# Patient Record
Sex: Female | Born: 1980 | Hispanic: Yes | Marital: Single | Smoking: Never smoker
Health system: Southern US, Community
[De-identification: ages and names within clinical notes are randomized; demographics above are authoritative.]

## PROBLEM LIST (undated history)

## (undated) DIAGNOSIS — E119 Type 2 diabetes mellitus without complications: Secondary | ICD-10-CM

## (undated) HISTORY — PX: OTHER SURGICAL HISTORY: SHX169

---

## 2021-01-02 ENCOUNTER — Emergency Department
Admission: EM | Admit: 2021-01-02 | Discharge: 2021-01-03 | Disposition: A | Discharge status: Home / Self Care | Payer: Self-pay | Attending: Emergency Medicine | Admitting: Emergency Medicine

## 2021-01-02 ENCOUNTER — Encounter: Payer: Self-pay | Admitting: Emergency Medicine

## 2021-01-02 ENCOUNTER — Other Ambulatory Visit: Payer: Self-pay

## 2021-01-02 DIAGNOSIS — E119 Type 2 diabetes mellitus without complications: Secondary | ICD-10-CM | POA: Insufficient documentation

## 2021-01-02 DIAGNOSIS — R1084 Generalized abdominal pain: Secondary | ICD-10-CM | POA: Insufficient documentation

## 2021-01-02 DIAGNOSIS — R197 Diarrhea, unspecified: Secondary | ICD-10-CM | POA: Insufficient documentation

## 2021-01-02 DIAGNOSIS — R110 Nausea: Secondary | ICD-10-CM | POA: Insufficient documentation

## 2021-01-02 HISTORY — DX: Type 2 diabetes mellitus without complications: E11.9

## 2021-01-02 LAB — COMPREHENSIVE METABOLIC PANEL
ALT: 18 U/L (ref 0–44)
AST: 21 U/L (ref 15–41)
Albumin: 4.2 g/dL (ref 3.5–5.0)
Alkaline Phosphatase: 65 U/L (ref 38–126)
Anion gap: 10 (ref 5–15)
BUN: 13 mg/dL (ref 6–20)
CO2: 23 mmol/L (ref 22–32)
Calcium: 9 mg/dL (ref 8.9–10.3)
Chloride: 100 mmol/L (ref 98–111)
Creatinine, Ser: 0.58 mg/dL (ref 0.44–1.00)
GFR, Estimated: >60 mL/min (ref >60)
Glucose, Bld: 152 mg/dL — ABNORMAL HIGH (ref 70–99)
Potassium: 3.7 mmol/L (ref 3.5–5.1)
Sodium: 133 mmol/L — ABNORMAL LOW (ref 135–145)
Total Bilirubin: 0.6 mg/dL (ref 0.3–1.2)
Total Protein: 8 g/dL (ref 6.5–8.1)

## 2021-01-02 LAB — CBC
HCT: 41 % (ref 36.0–46.0)
Hemoglobin: 14.3 g/dL (ref 12.0–15.0)
MCH: 30.5 pg (ref 26.0–34.0)
MCHC: 34.9 g/dL (ref 30.0–36.0)
MCV: 87.4 fL (ref 80.0–100.0)
Platelets: 439 10*3/uL — ABNORMAL HIGH (ref 150–400)
RBC: 4.69 MIL/uL (ref 3.87–5.11)
RDW: 11.9 % (ref 11.5–15.5)
WBC: 18.7 10*3/uL — ABNORMAL HIGH (ref 4.0–10.5)
nRBC: 0 % (ref 0.0–0.2)

## 2021-01-02 LAB — URINALYSIS, COMPLETE (UACMP) WITH MICROSCOPIC
Bilirubin Urine: NEGATIVE
Glucose, UA: 150 mg/dL — AB
Hgb urine dipstick: NEGATIVE
Ketones, ur: 20 mg/dL — AB
Leukocytes,Ua: NEGATIVE
Nitrite: NEGATIVE
Protein, ur: 100 mg/dL — AB
Specific Gravity, Urine: 1.033 — ABNORMAL HIGH (ref 1.005–1.030)
pH: 5 (ref 5.0–8.0)

## 2021-01-02 LAB — LIPASE, BLOOD: Lipase: 22 U/L (ref 11–51)

## 2021-01-02 MED ORDER — MORPHINE SULFATE (PF) 4 MG/ML IV SOLN
4.0000 mg | Freq: Once | INTRAVENOUS | Status: AC
  Administered 2021-01-03: 4 mg via INTRAVENOUS
  Filled 2021-01-02: qty 1

## 2021-01-02 MED ORDER — ONDANSETRON HCL 4 MG/2ML IJ SOLN
4.0000 mg | Freq: Once | INTRAMUSCULAR | Status: AC
  Administered 2021-01-03: 4 mg via INTRAVENOUS
  Filled 2021-01-02: qty 2

## 2021-01-02 MED ORDER — LACTATED RINGERS IV BOLUS
1000.0000 mL | Freq: Once | INTRAVENOUS | Status: AC
  Administered 2021-01-03: 1000 mL via INTRAVENOUS

## 2021-01-02 NOTE — ED Triage Notes (Signed)
Pt reports that she is having abd pain for the last few days. Denies any N/V has had small BMs.

## 2021-01-03 ENCOUNTER — Emergency Department: Payer: Self-pay

## 2021-01-03 ENCOUNTER — Encounter: Payer: Self-pay | Admitting: Radiology

## 2021-01-03 LAB — PREGNANCY, URINE: Preg Test, Ur: NEGATIVE

## 2021-01-03 MED ORDER — DICYCLOMINE HCL 20 MG PO TABS: 20.0000 mg | ORAL_TABLET | Freq: Three times a day (TID) | ORAL | 0 refills | Status: AC

## 2021-01-03 MED ORDER — IOHEXOL 350 MG/ML SOLN
100.0000 mL | Freq: Once | INTRAVENOUS | Status: AC | PRN
  Administered 2021-01-03: 100 mL via INTRAVENOUS

## 2021-01-03 MED ORDER — ONDANSETRON 4 MG PO TBDP: 4.0000 mg | ORAL_TABLET | Freq: Three times a day (TID) | ORAL | 0 refills | Status: DC | PRN

## 2021-01-03 MED ORDER — IBUPROFEN 600 MG PO TABS: 600.0000 mg | ORAL_TABLET | Freq: Three times a day (TID) | ORAL | 0 refills | Status: DC | PRN

## 2021-01-03 NOTE — ED Notes (Signed)
Dr. Erma Heritage at bedside. Interpreter assist in MD and nursing assessment.

## 2021-01-03 NOTE — ED Notes (Signed)
Pt. Provided gingerale, ice water, and graham crackers for PO challenge.

## 2021-01-03 NOTE — ED Notes (Signed)
Pt. Placed on cont. Pulse ox and BP monitoring.

## 2021-01-03 NOTE — ED Provider Notes (Signed)
[facility] Emergency Department Provider Note  ____________________________________________   Event Date/Time   First MD Initiated Contact with Patient 01/02/21 2344     (approximate)  I have reviewed the triage vital signs and the nursing notes.   HISTORY  Chief Complaint Abdominal Pain    HPI Danielle Lozano is a 40 y.o. female with history of diabetes here with generalized abdominal pain, nausea, diarrhea.  The patient states that for the last 2 or 3 days, she has had mild, generalized, abdominal pain.  She said associated nausea and loose stools.  She has had associated abdominal cramping.  Denies known sick contacts.  No suspicious food intakes.  No specific alleviating or aggravating factors.  Pain seems to be intermittent and not persistent.No known COVID exposures.  No blood in her stools.  No urinary symptoms or vaginal bleeding or discharge.    Past Medical History:  Diagnosis Date   Diabetes (HCC)     There are no problems to display for this patient.     Prior to Admission medications   Medication Sig Start Date End Date Taking? Authorizing Provider  dicyclomine (BENTYL) 20 MG tablet Take 1 tablet (20 mg total) by mouth 3 (three) times daily before meals for 7 days. 01/03/21 01/10/21 Yes Shaune Pollack, MD  ibuprofen (ADVIL) 600 MG tablet Take 1 tablet (600 mg total) by mouth every 8 (eight) hours as needed. 01/03/21  Yes Shaune Pollack, MD  ondansetron (ZOFRAN ODT) 4 MG disintegrating tablet Take 1 tablet (4 mg total) by mouth every 8 (eight) hours as needed for nausea or vomiting. 01/03/21  Yes Shaune Pollack, MD    Allergies Patient has no known allergies.  No family history on file.  Social History    Review of Systems  Review of Systems  Constitutional:  Positive for fatigue. Negative for fever.  HENT:  Negative for congestion and sore throat.   Eyes:  Negative for visual disturbance.  Respiratory:  Negative for cough  and shortness of breath.   Cardiovascular:  Negative for chest pain.  Gastrointestinal:  Positive for abdominal pain, diarrhea, nausea and vomiting.  Genitourinary:  Negative for flank pain.  Musculoskeletal:  Negative for back pain and neck pain.  Skin:  Negative for rash and wound.  Neurological:  Negative for weakness.  All other systems reviewed and are negative.   ____________________________________________  PHYSICAL EXAM:      VITAL SIGNS: ED Triage Vitals  Enc Vitals Group     BP 01/02/21 1911 125/73     Pulse Rate 01/02/21 1905 94     Resp 01/02/21 1905 18     Temp 01/02/21 1905 98.3 F (36.8 C)     Temp Source 01/02/21 1905 Oral     SpO2 01/02/21 1905 98 %     Weight 01/02/21 1910 182 lb (82.6 kg)     Height 01/02/21 1910 5\' 1"  (1.549 m)     Head Circumference --      Peak Flow --      Pain Score 01/02/21 1911 10     Pain Loc --      Pain Edu? --      Excl. in GC? --      Physical Exam Vitals and nursing note reviewed.  Constitutional:      General: She is not in acute distress.    Appearance: She is well-developed.  HENT:     Head: Normocephalic and atraumatic.  Eyes:  Conjunctiva/sclera: Conjunctivae normal.  Cardiovascular:     Rate and Rhythm: Normal rate and regular rhythm.     Heart sounds: Normal heart sounds. No murmur heard.   No friction rub.  Pulmonary:     Effort: Pulmonary effort is normal. No respiratory distress.     Breath sounds: Normal breath sounds. No wheezing or rales.  Abdominal:     General: Bowel sounds are increased. There is no distension.     Palpations: Abdomen is soft.     Tenderness: There is no abdominal tenderness. There is no guarding or rebound. Negative signs include Murphy's sign and McBurney's sign.  Musculoskeletal:     Cervical back: Neck supple.  Skin:    General: Skin is warm.     Capillary Refill: Capillary refill takes less than 2 seconds.  Neurological:     Mental Status: She is alert and oriented to  person, place, and time.     Motor: No abnormal muscle tone.      ____________________________________________   LABS (all labs ordered are listed, but only abnormal results are displayed)  Labs Reviewed  COMPREHENSIVE METABOLIC PANEL - Abnormal; Notable for the following components:      Result Value   Sodium 133 (*)    Glucose, Bld 152 (*)    All other components within normal limits  CBC - Abnormal; Notable for the following components:   WBC 18.7 (*)    Platelets 439 (*)    All other components within normal limits  URINALYSIS, COMPLETE (UACMP) WITH MICROSCOPIC - Abnormal; Notable for the following components:   Color, Urine YELLOW (*)    APPearance CLOUDY (*)    Specific Gravity, Urine 1.033 (*)    Glucose, UA 150 (*)    Ketones, ur 20 (*)    Protein, ur 100 (*)    Bacteria, UA FEW (*)    All other components within normal limits  LIPASE, BLOOD  PREGNANCY, URINE  POC URINE PREG, ED    ____________________________________________  EKG:  ________________________________________  RADIOLOGY All imaging, including plain films, CT scans, and ultrasounds, independently reviewed by me, and interpretations confirmed via formal radiology reads.  ED MD interpretation:   CT abdomen/pelvis: Fatty liver, otherwise no acute changes  Official radiology report(s): CT ABDOMEN PELVIS W CONTRAST  Result Date: 01/03/2021 CLINICAL DATA:  Nausea and vomiting for few days EXAM: CT ABDOMEN AND PELVIS WITH CONTRAST TECHNIQUE: Multidetector CT imaging of the abdomen and pelvis was performed using the standard protocol following bolus administration of intravenous contrast. CONTRAST:  OMNIPAQUE IOHEXOL 350 MG/ML SOLN COMPARISON:  None. FINDINGS: Lower chest: No acute abnormality. Hepatobiliary: Mild fatty infiltration of the liver is noted. No focal mass is seen. Gallbladder is within normal limits. Pancreas: Unremarkable. No pancreatic ductal dilatation or surrounding inflammatory  changes. Spleen: Normal in size without focal abnormality. Adrenals/Urinary Tract: Adrenal glands are within normal limits. Kidneys demonstrate a normal enhancement pattern bilaterally. No renal calculi are seen. The ureters are within normal limits bilaterally. Stomach/Bowel: Appendix is within normal limits. No obstructive or inflammatory changes of the colon are seen. Small bowel and stomach are unremarkable. Vascular/Lymphatic: No significant vascular findings are present. No enlarged abdominal or pelvic lymph nodes. Reproductive: Heterogeneity in the uterus is noted which may represent mild leiomyomatous change. Right ovary appears within normal limits. Left ovary demonstrates a 3.2 cm cystic lesion Other: No abdominal wall hernia or abnormality. No abdominopelvic ascites. Musculoskeletal: No acute or significant osseous findings. IMPRESSION: Fatty liver. Changes consistent  with uterine fibroid change. Left ovarian cystic lesion measuring 3.2 cm. No follow-up imaging recommended. Note: This recommendation does not apply to premenarchal patients and to those with increased risk (genetic, family history, elevated tumor markers or other high-risk factors) of ovarian cancer. Reference: JACR 2020 Feb; 17(2):248-254 Electronically Signed   By: Alcide Clever M.D.   On: 01/03/2021 01:12    ____________________________________________  PROCEDURES   Procedure(s) performed (including Critical Care):  Procedures  ____________________________________________  INITIAL IMPRESSION / MDM / ASSESSMENT AND PLAN / ED COURSE  As part of my medical decision making, I reviewed the following data within the electronic MEDICAL RECORD NUMBER Nursing notes reviewed and incorporated, Old chart reviewed, Notes from prior ED visits, and [place] Controlled Substance Database       *Danielle Lozano was evaluated in Emergency Department on 01/03/2021 for the symptoms described in the history of present illness. She was evaluated  in the context of the global COVID-19 pandemic, which necessitated consideration that the patient might be at risk for infection with the SARS-CoV-2 virus that causes COVID-19. Institutional protocols and algorithms that pertain to the evaluation of patients at risk for COVID-19 are in a state of rapid change based on information released by regulatory bodies including the CDC and federal and state organizations. These policies and algorithms were followed during the patient's care in the ED.  Some ED evaluations and interventions may be delayed as a result of limited staffing during the pandemic.*     Medical Decision Making: 40 year old female here with mild, generalized abdominal pain.  Lab work shows moderate leukocytosis and mild dehydration with ketonuria.  Otherwise, patient is overall nontoxic and well-appearing.  CMP with normal LFTs and renal function.  Lipase normal.  CT abdomen and pelvis obtained, reviewed, shows no evidence of acute abnormality.  Specifically, no evidence of appendicitis, diverticulitis, or other surgical abnormality.  She denies any urinary symptoms or signs of UTI or other GU pathology.  She feels better in the ED.  Will discharge with supportive care and outpatient follow-up for possible viral versus foodborne illness.  Return precautions given.  ____________________________________________  FINAL CLINICAL IMPRESSION(S) / ED DIAGNOSES  Final diagnoses:  Generalized abdominal pain     MEDICATIONS GIVEN DURING THIS VISIT:  Medications  lactated ringers bolus 1,000 mL (0 mLs Intravenous Stopped 01/03/21 0139)  ondansetron (ZOFRAN) injection 4 mg (4 mg Intravenous Given 01/03/21 0031)  morphine 4 MG/ML injection 4 mg (4 mg Intravenous Given 01/03/21 0032)  iohexol (OMNIPAQUE) 350 MG/ML injection 100 mL (100 mLs Intravenous Contrast Given 01/03/21 0049)     ED Discharge Orders          Ordered    ondansetron (ZOFRAN ODT) 4 MG disintegrating tablet  Every 8 hours PRN         01/03/21 0154    dicyclomine (BENTYL) 20 MG tablet  3 times daily before meals        01/03/21 0154    ibuprofen (ADVIL) 600 MG tablet  Every 8 hours PRN        01/03/21 0154             Note:  This document was prepared using Dragon voice recognition software and may include unintentional dictation errors.   Shaune Pollack, MD 01/03/21 [phone]

## 2021-01-03 NOTE — ED Notes (Signed)
Pt in CT.

## 2021-04-12 ENCOUNTER — Emergency Department
Admission: EM | Admit: 2021-04-12 | Discharge: 2021-04-12 | Disposition: A | Discharge status: Home / Self Care | Payer: Self-pay | Attending: Emergency Medicine | Admitting: Emergency Medicine

## 2021-04-12 ENCOUNTER — Other Ambulatory Visit: Payer: Self-pay

## 2021-04-12 DIAGNOSIS — E119 Type 2 diabetes mellitus without complications: Secondary | ICD-10-CM | POA: Insufficient documentation

## 2021-04-12 DIAGNOSIS — S0101XA Laceration without foreign body of scalp, initial encounter: Secondary | ICD-10-CM | POA: Insufficient documentation

## 2021-04-12 DIAGNOSIS — S0990XA Unspecified injury of head, initial encounter: Secondary | ICD-10-CM

## 2021-04-12 DIAGNOSIS — W208XXA Other cause of strike by thrown, projected or falling object, initial encounter: Secondary | ICD-10-CM | POA: Insufficient documentation

## 2021-04-12 MED ORDER — LIDOCAINE-EPINEPHRINE-TETRACAINE (LET) TOPICAL GEL
3.0000 mL | Freq: Once | TOPICAL | Status: AC
  Administered 2021-04-12: 3 mL via TOPICAL
  Filled 2021-04-12: qty 3

## 2021-04-12 MED ORDER — METOCLOPRAMIDE HCL 10 MG PO TABS
10.0000 mg | ORAL_TABLET | Freq: Once | ORAL | Status: AC
  Administered 2021-04-12: 10 mg via ORAL
  Filled 2021-04-12: qty 1

## 2021-04-12 MED ORDER — IBUPROFEN 800 MG PO TABS
800.0000 mg | ORAL_TABLET | Freq: Once | ORAL | Status: AC
  Administered 2021-04-12: 800 mg via ORAL
  Filled 2021-04-12: qty 1

## 2021-04-12 NOTE — ED Triage Notes (Signed)
Pt states that she was hit in the head- pt states a tool for digging holes was hanging and it fell on the top of her head- pt states that it bled a little bit- no active bleeding at this time- pt did take 1000mg  tylenol

## 2021-04-12 NOTE — Discharge Instructions (Signed)
Avoid any lotion, cream, ointment, glue, or hair products on the glued scalp. The glue will remain in place for approximately 5-7 days, then crack and peel away. Take OTC Tylenol and Motrin as needed for headache pain relief. Follow-up with your provider or [facility] as needed.   Evite cualquier locin, crema, ungento, pegamento o productos para el cabello en el cuero cabelludo pegado. El Presenter, broadcasting en su lugar durante aproximadamente 5 a 7 das, luego se Retail buyer y Therapist, nutritional. Tome Tylenol y Motrin de venta libre segn sea necesario para Engineer, materials de [place]. Seguimiento con su proveedor o [facility] segn sea necesario.

## 2021-04-12 NOTE — ED Provider Notes (Addendum)
[facility] Emergency Department Provider Note ____________________________________________  Time seen: 1614  I have reviewed the triage vital signs and the nursing notes.  HISTORY  Chief Complaint  Head Injury  History limited by Spanish language.  Tele-interpreter used for interview and exam.  HPI Danielle Lozano is a 40 y.o. female presents to the ED with a superficial laceration to the scalp.  She describes a pull digging tool was hanging overhead, and she walked on it and it fell hitting her in the top of the head.  She reports a minimal amount of bleeding and denies any LOC.  She took 1000 elemental Tylenol prior to arrival.  She denies any other injury at this time.  Past Medical History:  Diagnosis Date   Diabetes (HCC)     There are no problems to display for this patient.   History reviewed. No pertinent surgical history.  Prior to Admission medications   Medication Sig Start Date End Date Taking? Authorizing Provider  dicyclomine (BENTYL) 20 MG tablet Take 1 tablet (20 mg total) by mouth 3 (three) times daily before meals for 7 days. 01/03/21 01/10/21  Shaune Pollack, MD  ibuprofen (ADVIL) 600 MG tablet Take 1 tablet (600 mg total) by mouth every 8 (eight) hours as needed. 01/03/21   Shaune Pollack, MD  ondansetron (ZOFRAN ODT) 4 MG disintegrating tablet Take 1 tablet (4 mg total) by mouth every 8 (eight) hours as needed for nausea or vomiting. 01/03/21   Shaune Pollack, MD    Allergies Patient has no known allergies.  No family history on file.  Social History    Review of Systems  Constitutional: Negative for fever. Eyes: Negative for visual changes. ENT: Negative for sore throat. Cardiovascular: Negative for chest pain. Respiratory: Negative for shortness of breath. Gastrointestinal: Negative for abdominal pain, vomiting and diarrhea. Genitourinary: Negative for dysuria. Musculoskeletal: Negative for back pain. Skin: Negative  for rash.  Scalp laceration as above Neurological: Negative for headaches, focal weakness or numbness. ____________________________________________  PHYSICAL EXAM:  VITAL SIGNS: ED Triage Vitals  Enc Vitals Group     BP 04/12/21 1551 127/79     Pulse Rate 04/12/21 1551 70     Resp 04/12/21 1551 18     Temp 04/12/21 1551 98.1 F (36.7 C)     Temp Source 04/12/21 1551 Oral     SpO2 04/12/21 1551 98 %     Weight 04/12/21 1600 285 lb (129.3 kg)     Height 04/12/21 1600 5' (1.524 m)     Head Circumference --      Peak Flow --      Pain Score 04/12/21 1600 8     Pain Loc --      Pain Edu? --      Excl. in GC? --     Constitutional: Alert and oriented. Well appearing and in no distress. GCS=15 Head: Normocephalic and atraumatic, except for a 3 cm linear laceration in horizontal lie to the frontal part of the scalp.  No active bleeding is appreciated.. Eyes: Conjunctivae are normal. PERRL. Normal extraocular movements Ears: Canals clear. TMs intact bilaterally. Nose: No epistaxis. Mouth/Throat: Mucous membranes are moist. Neck: Supple. Normal ROM Cardiovascular: Normal rate, regular rhythm. Normal distal pulses. Respiratory: Normal respiratory effort. No wheezes/rales/rhonchi. Gastrointestinal: Soft and nontender. No distention. Musculoskeletal: Nontender with normal range of motion in all extremities.  Neurologic:  Normal gait without ataxia. Normal speech and language. No gross focal neurologic deficits are appreciated. Skin:  Skin  is warm, dry and intact. No rash noted. Psychiatric: Mood and affect are normal. Patient exhibits appropriate insight and judgment. ____________________________________________    {LABS (pertinent positives/negatives)  ____________________________________________  {EKG  ____________________________________________   RADIOLOGY Official radiology report(s): No results found. ____________________________________________  PROCEDURES  IBU 800  mg PO Reglan 10 mg PO  .Marland KitchenLaceration Repair  Date/Time: 04/12/2021 4:47 PM Performed by: Melvenia Needles, PA-C Authorized by: Melvenia Needles, PA-C   Consent:    Consent obtained:  Verbal   Consent given by:  Patient   Risks discussed:  Pain and poor wound healing   Alternatives discussed:  No treatment Universal protocol:    Site/side marked: yes     Patient identity confirmed:  Verbally with patient Anesthesia:    Anesthesia method:  Topical application   Topical anesthetic:  LET Laceration details:    Location:  Scalp   Scalp location:  Frontal   Length (cm):  3   Depth (mm):  3 Pre-procedure details:    Preparation:  Patient was prepped and draped in usual sterile fashion Exploration:    Limited defect created (wound extended): no     Hemostasis achieved with:  LET   Contaminated: no   Treatment:    Area cleansed with:  Saline   Amount of cleaning:  Standard   Irrigation solution:  Sterile saline   Irrigation volume:  10   Irrigation method:  Syringe   Debridement:  None   Undermining:  None   Scar revision: no   Skin repair:    Repair method:  Tissue adhesive Approximation:    Approximation:  Close Repair type:    Repair type:  Simple Post-procedure details:    Dressing:  Open (no dressing)   Procedure completion:  Tolerated well, no immediate complications ____________________________________________   INITIAL IMPRESSION / ASSESSMENT AND PLAN / ED COURSE  As part of my medical decision making, I reviewed the following data within the electronic MEDICAL RECORD NUMBER Notes from prior ED visits   Patient ED evaluation of an axonal laceration to the scalp patient presents with linear laceration to frontal scalp no active bleeding.  She reports only mild headache denies any LOC visual disturbance or distal paresthesias.  Exam is reassuring and shows no signs of acute neuromuscular deficit or serious closed head injury.  She consents to suture repair  and wound adhesive was used to approximate the skin edges.  She is discharged wound care instructions will follow with primary provider or return to the ED if necessary.   [name] was evaluated in Emergency Department on 04/15/2021 for the symptoms described in the history of present illness. She was evaluated in the context of the global COVID-19 pandemic, which necessitated consideration that the patient might be at risk for infection with the SARS-CoV-2 virus that causes COVID-19. Institutional protocols and algorithms that pertain to the evaluation of patients at risk for COVID-19 are in a state of rapid change based on information released by regulatory bodies including the CDC and federal and state organizations. These policies and algorithms were followed during the patient's care in the ED.  ____________________________________________  FINAL CLINICAL IMPRESSION(S) / ED DIAGNOSES  Final diagnoses:  Minor head injury, initial encounter  Laceration of scalp, initial encounter      Melvenia Needles, PA-C 04/15/21 0709    Carmie End, Dannielle Karvonen, PA-C 04/15/21 0710    Naaman Plummer, MD 04/15/21 [phone]

## 2021-07-31 ENCOUNTER — Ambulatory Visit (LOCAL_COMMUNITY_HEALTH_CENTER): Payer: Self-pay | Admitting: Family Medicine

## 2021-07-31 ENCOUNTER — Encounter: Payer: Self-pay | Admitting: Family Medicine

## 2021-07-31 ENCOUNTER — Other Ambulatory Visit: Payer: Self-pay

## 2021-07-31 VITALS — BP 112/74 | Ht 60.5 in | Wt 188.2 lb

## 2021-07-31 DIAGNOSIS — Z309 Encounter for contraceptive management, unspecified: Secondary | ICD-10-CM

## 2021-07-31 DIAGNOSIS — Z0000 Encounter for general adult medical examination without abnormal findings: Secondary | ICD-10-CM

## 2021-07-31 DIAGNOSIS — Z3202 Encounter for pregnancy test, result negative: Secondary | ICD-10-CM

## 2021-07-31 DIAGNOSIS — Z113 Encounter for screening for infections with a predominantly sexual mode of transmission: Secondary | ICD-10-CM

## 2021-07-31 DIAGNOSIS — Z30011 Encounter for initial prescription of contraceptive pills: Secondary | ICD-10-CM

## 2021-07-31 LAB — WET PREP FOR TRICH, YEAST, CLUE
Trichomonas Exam: NEGATIVE
Yeast Exam: NEGATIVE

## 2021-07-31 LAB — PREGNANCY, URINE: Preg Test, Ur: NEGATIVE

## 2021-07-31 LAB — HM HIV SCREENING LAB: HM HIV Screening: NEGATIVE

## 2021-07-31 MED ORDER — NORGESTIM-ETH ESTRAD TRIPHASIC 0.18/0.215/0.25 MG-25 MCG PO TABS: 1.0000 | ORAL_TABLET | Freq: Every day | ORAL | 12 refills | Status: DC

## 2021-07-31 NOTE — Progress Notes (Signed)
Pt here for PE and BC.  Wet mount results reviewed, no treatment required.  Pt notified of negative PT.  Tri Lo Sprintec # 6 given to pt.  Pt informed to contact the [facility], in 6 months, to receive more OCP's.  Windle Guard, RN ? ?

## 2021-08-01 LAB — HGB A1C W/O EAG: Hgb A1c MFr Bld: 6.1 % — ABNORMAL HIGH (ref 4.8–5.6)

## 2021-08-03 NOTE — Progress Notes (Signed)
[facility] ?[place] ?Main Number: [phone] ? ? ? ?Family Planning Visit- Initial Visit ? ?Subjective:  ?Danielle Lozano is a 41 y.o.  G5P0   being seen today for an initial annual visit and to discuss reproductive life planning.  The patient is currently using No Method - No Contraceptive Precautions for pregnancy prevention. Patient reports   does not want a pregnancy in the next year.  Patient has the following medical conditions does not have a problem list on file. ? ?Chief Complaint  ?Patient presents with  ? Annual Exam  ? Contraception  ? ? ?Patient reports here for physical and BC  ? ?Patient denies concerns   ? ?Body mass index is 36.15 kg/m?. - Patient is eligible for diabetes screening based on BMI and age >57?  yes ?HA1C ordered? yes ? ?Patient reports 1  partner/s in last year. Desires STI screening?  Yes ? ?Has patient been screened once for HCV in the past?  No ? No results found for: HCVAB ? ?Does the patient have current drug use (including MJ), have a partner with drug use, and/or has been incarcerated since last result? No  ?If yes-- Screen for HCV through [facility] ?  ?Does the patient meet criteria for HBV testing? No ? ?Criteria:  ?-Household, sexual or needle sharing contact with HBV ?-History of drug use ?-HIV positive ?-Those with known Hep C ? ? ?Health Maintenance Due  ?Topic Date Due  ? COVID-19 Vaccine (1) Never done  ? HIV Screening  Never done  ? Hepatitis C Screening  Never done  ? INFLUENZA VACCINE  12/29/2020  ? ? ?Review of Systems  ?Constitutional:  Negative for chills, fever, malaise/fatigue and weight loss.  ?HENT:  Negative for congestion, hearing loss and sore throat.   ?Eyes:  Negative for blurred vision, double vision and photophobia.  ?Respiratory:  Negative for shortness of breath.   ?Cardiovascular:  Negative for chest pain.  ?Gastrointestinal:  Negative for abdominal pain, blood in stool,  constipation, diarrhea, heartburn, nausea and vomiting.  ?Genitourinary:  Negative for dysuria and frequency.  ?Musculoskeletal:  Negative for back pain, joint pain and neck pain.  ?Skin:  Negative for itching and rash.  ?Neurological:  Negative for dizziness, weakness and headaches.  ?Endo/Heme/Allergies:  Does not bruise/bleed easily.  ?Psychiatric/Behavioral:  Negative for depression, substance abuse and suicidal ideas.   ? ?The following portions of the patient's history were reviewed and updated as appropriate: allergies, current medications, past family history, past medical history, past social history, past surgical history and problem list. Problem list updated. ? ? ?See flowsheet for other program required questions. ? ?Objective:  ? ?Vitals:  ? 07/31/21 0918  ?BP: 112/74  ?Weight: 188 lb 3.2 oz (85.4 kg)  ?Height: 5' 0.5" (1.537 m)  ? ? ?Physical Exam ?Vitals and nursing note reviewed.  ?Constitutional:   ?   Appearance: Normal appearance.  ?HENT:  ?   Head: Normocephalic and atraumatic.  ?   Mouth/Throat:  ?   Mouth: Mucous membranes are moist.  ?   Dentition: Normal dentition. No dental caries.  ?   Pharynx: No oropharyngeal exudate or posterior oropharyngeal erythema.  ?Eyes:  ?   General: No scleral icterus. ?Neck:  ?   Thyroid: No thyroid mass, thyromegaly or thyroid tenderness.  ?Cardiovascular:  ?   Rate and Rhythm: Normal rate and regular rhythm.  ?   Pulses: Normal pulses.  ?  Heart sounds: Normal heart sounds.  ?Pulmonary:  ?   Effort: Pulmonary effort is normal.  ?   Breath sounds: Normal breath sounds.  ?Chest:  ?Breasts: ?   Tanner Score is 5.  ?   Breasts are symmetrical.  ?   Right: Normal. No swelling, bleeding, inverted nipple, mass or tenderness.  ?   Left: Normal. No swelling, bleeding, inverted nipple, mass or tenderness.  ?Abdominal:  ?   General: Abdomen is flat. Bowel sounds are normal.  ?   Palpations: Abdomen is soft.  ?Genitourinary: ?   Comments: Deferred - pt self collected   ?Musculoskeletal:     ?   General: Normal range of motion.  ?   Cervical back: Normal range of motion and neck supple.  ?Skin: ?   General: Skin is warm and dry.  ?Neurological:  ?   General: No focal deficit present.  ?   Mental Status: She is alert and oriented to person, place, and time.  ?Psychiatric:     ?   Mood and Affect: Mood normal.     ?   Behavior: Behavior normal.  ? ? ? ? ?Assessment and Plan:  ?Danielle Lozano is a 41 y.o. female presenting to the [facility] Department for an initial annual wellness/contraceptive visit ? ?Contraception counseling: Reviewed all forms of birth control options in the tiered based approach. available including abstinence; over the counter/barrier methods; hormonal contraceptive medication including pill, patch, ring, injection,contraceptive implant, ECP; hormonal and nonhormonal IUDs; permanent sterilization options including vasectomy and the various tubal sterilization modalities. Risks, benefits, and typical effectiveness rates were reviewed.  Questions were answered.  Written information was also given to the patient to review.  Patient desires Oral Contraceptive, this was prescribed for patient.  ? ? ?The patient will follow up in  6 months for surveillance.  The patient was told to call with any further questions, or with any concerns about this method of contraception.  Emphasized use of condoms 100% of the time for STI prevention. ? ?Patient was not offered ECP based on last sex  ? ?1. Routine general medical examination at a health care facility ?Well woman exam,  ?CBE today  ?Pap due  ?- Hgb A1c w/o eAG ? ?2. Screening examination for venereal disease ?Patient accepted all screenings including wet prep, vaginal CT/GC and bloodwork for HIV/RPR.  ? ? ?Wet prep results neg    ?No Treatment needed  ?Discussed time line for [facility] results and that patient will be called with positive results and encouraged patient to call if she had not heard in  2 weeks.  ?Counseled to return or seek care for continued or worsening symptoms ?Recommended condom use with all sex ? ?  ?- Chlamydia/Gonorrhea [facility] ?- HIV [facility] ?- WET PREP FOR TRICH, YEAST, CLUE ?- Syphilis Serology, [facility] ? ?3. Encounter for initial prescription of contraceptive pills ?Pt desires OCP's  ?- Pregnancy, urine ?- Norgestimate-Ethinyl Estradiol Triphasic (TRI-LO-SPRINTEC) 0.18/0.215/0.25 MG-25 MCG tab; Take 1 tablet by mouth daily.  Dispense: 28 tablet; Refill: 12 ? ? ?No follow-ups on file. ? ?No future appointments. ?[facility] agency interpreter  used for Spanish interpretation.     ? ?Junious Dresser, FNP ? ?

## 2021-08-03 NOTE — Progress Notes (Signed)
Reviewed lab  pt has hx of GDM.   ?Will contact patient to inform of pcp follow up needed.

## 2022-01-05 ENCOUNTER — Ambulatory Visit (LOCAL_COMMUNITY_HEALTH_CENTER): Payer: Self-pay

## 2022-01-05 VITALS — BP 125/68 | Ht 60.5 in | Wt 190.0 lb

## 2022-01-05 DIAGNOSIS — Z3041 Encounter for surveillance of contraceptive pills: Secondary | ICD-10-CM

## 2022-01-05 DIAGNOSIS — Z3009 Encounter for other general counseling and advice on contraception: Secondary | ICD-10-CM

## 2022-01-05 MED ORDER — NORGESTIM-ETH ESTRAD TRIPHASIC 0.18/0.215/0.25 MG-25 MCG PO TABS: 1.0000 | ORAL_TABLET | Freq: Every day | ORAL | 0 refills | Status: DC

## 2022-01-05 NOTE — Progress Notes (Signed)
In nurse clinic for oral contraceptive refill.    Language line interpreter Arianna id [phone]  States on last row of last pack of pills.  Pt says she occasionally misses a pill and takes two the following day.  Stressed importance of taking pill daily at the same time and if late/misses pill she needs to use condoms as BUM until has menses.    The patient was dispensed Tri Lo Sprintec 28 day #6 packs today. I provided counseling today regarding the medication. We discussed the medication, the side effects and when to call clinic. Patient given the opportunity to ask questions. Questions answered.      Cherlynn Polo, RN

## 2022-01-05 NOTE — Addendum Note (Signed)
Addended by: Henriette Combs B on: 01/05/2022 01:22 PM   Modules accepted: Orders

## 2022-03-20 ENCOUNTER — Emergency Department
Admission: EM | Admit: 2022-03-20 | Discharge: 2022-03-20 | Disposition: A | Discharge status: Home / Self Care | Payer: Self-pay | Attending: Emergency Medicine | Admitting: Emergency Medicine

## 2022-03-20 ENCOUNTER — Other Ambulatory Visit: Payer: Self-pay

## 2022-03-20 ENCOUNTER — Emergency Department: Payer: Self-pay

## 2022-03-20 DIAGNOSIS — J189 Pneumonia, unspecified organism: Secondary | ICD-10-CM | POA: Insufficient documentation

## 2022-03-20 DIAGNOSIS — Z1152 Encounter for screening for COVID-19: Secondary | ICD-10-CM | POA: Insufficient documentation

## 2022-03-20 LAB — RESP PANEL BY RT-PCR (FLU A&B, COVID) ARPGX2
Influenza A by PCR: NEGATIVE
Influenza B by PCR: NEGATIVE
SARS Coronavirus 2 by RT PCR: NEGATIVE

## 2022-03-20 MED ORDER — AZITHROMYCIN 500 MG PO TABS
500.0000 mg | ORAL_TABLET | Freq: Once | ORAL | Status: AC
  Administered 2022-03-20: 500 mg via ORAL
  Filled 2022-03-20: qty 1

## 2022-03-20 MED ORDER — PSEUDOEPH-BROMPHEN-DM 30-2-10 MG/5ML PO SYRP: 5.0000 mL | ORAL_SOLUTION | Freq: Four times a day (QID) | ORAL | 0 refills | Status: AC | PRN

## 2022-03-20 MED ORDER — BENZONATATE 100 MG PO CAPS: ORAL_CAPSULE | ORAL | 0 refills | Status: AC

## 2022-03-20 MED ORDER — AZITHROMYCIN 250 MG PO TABS: 250.0000 mg | ORAL_TABLET | Freq: Every day | ORAL | 0 refills | Status: AC

## 2022-03-20 NOTE — ED Triage Notes (Signed)
Pt to ED for unrelenting cough since 1 week, also nasal congestion, watery eyes. States can hardly stop coughing and throat feels dry and inflammed. States whole body hurts from coughing so much.

## 2022-03-20 NOTE — Discharge Instructions (Addendum)
Your exam is concerning for possible pneumonia.  You be treated with antibiotics and cough medicine.  Continue to monitor your symptoms and treat any fevers at home.  Follow-up with your primary provider or local community clinic for ongoing symptoms.  Su examen es preocupante por posible neumona. Recibir tratamiento con antibiticos y medicamentos para la tos. Contine controlando sus sntomas y tratando Brewing technologist. Haga un seguimiento con su proveedor de atencin primaria o clnica comunitaria local para detectar sntomas persistentes.

## 2022-03-20 NOTE — ED Provider Notes (Signed)
[facility] Emergency Department Provider Note     Event Date/Time   First MD Initiated Contact with Patient 03/20/22 1959     (approximate)   History   Cough and Nasal Congestion   HPI  Tele-interpreter [phone] used for interview and exam.  Danielle Lozano is a 41 y.o. female presents to the ED for evaluation of persistent cough for the last week.  Patient also notes nasal congestion and watery eyes.  She also reports a dry cough and generalized body aches.     Physical Exam   Triage Vital Signs: ED Triage Vitals  Enc Vitals Group     BP 03/20/22 1845 (!) 155/99     Pulse Rate 03/20/22 1845 (!) 104     Resp 03/20/22 1845 18     Temp 03/20/22 1845 98.2 F (36.8 C)     Temp Source 03/20/22 1845 Oral     SpO2 03/20/22 1845 98 %     Weight 03/20/22 1847 200 lb (90.7 kg)     Height 03/20/22 1847 5\' 1"  (1.549 m)     Head Circumference --      Peak Flow --      Pain Score 03/20/22 1846 8     Pain Loc --      Pain Edu? --      Excl. in GC? --     Most recent vital signs: Vitals:   03/20/22 1845  BP: (!) 155/99  Pulse: (!) 104  Resp: 18  Temp: 98.2 F (36.8 C)  SpO2: 98%    General Awake, no distress. NAD HEENT NCAT. PERRL. EOMI. No rhinorrhea. Mucous membranes are moist.  CV:  Good peripheral perfusion.  RESP:  Normal effort. Mild rhonchi noted bilaterally ABD:  No distention.     ED Results / Procedures / Treatments   Labs (all labs ordered are listed, but only abnormal results are displayed) Labs Reviewed  RESP PANEL BY RT-PCR (FLU A&B, COVID) [id number]     EKG   RADIOLOGY  {**I personally viewed and evaluated these images as part of my medical decision making, as well as reviewing the written report by the radiologist.  ED Provider Interpretation: bilateral basilar opacities noted  DG Chest 2 View  Result Date: 03/20/2022 CLINICAL DATA:  Cough for 1 week. EXAM: CHEST - 2 VIEW COMPARISON:  None Available.  FINDINGS: Heart size is normal. Lung volumes are low. Patchy bibasilar airspace opacities are present bilaterally. No significant airspace consolidation is present. The visualized soft tissues and bony thorax are unremarkable. IMPRESSION: 1. Low lung volumes. 2. Patchy bibasilar airspace disease may reflect atelectasis or infection. Electronically Signed   By: 03/22/2022 M.D.   On: 03/20/2022 19:21     PROCEDURES:  Critical Care performed: No  Procedures   MEDICATIONS ORDERED IN ED: Medications  azithromycin (ZITHROMAX) tablet 500 mg (has no administration in time range)     IMPRESSION / MDM / ASSESSMENT AND PLAN / ED COURSE  I reviewed the triage vital signs and the nursing notes.                              Differential diagnosis includes, but is not limited to, COVID, flu, RSV, viral URI, AOM, CAP, bronchitis  Patient's presentation is most consistent with acute complicated illness / injury requiring diagnostic workup.  Patient's diagnosis is consistent with CAP. Her CXR shows some concerning bilateral basilar  changes, concerning for infection, based on my interpretation. Patient will be discharged home with prescriptions for azithromycin, Bromphed, and tessalon perles. Patient is to follow up with her PCP as needed or otherwise directed. Patient is given ED precautions to return to the ED for any worsening or new symptoms.     FINAL CLINICAL IMPRESSION(S) / ED DIAGNOSES   Final diagnoses:  Community acquired pneumonia, unspecified laterality     Rx / DC Orders   ED Discharge Orders          Ordered    azithromycin (ZITHROMAX Z-PAK) 250 MG tablet  Daily        03/20/22 2015    benzonatate (TESSALON PERLES) 100 MG capsule        03/20/22 2015    brompheniramine-pseudoephedrine-DM 30-2-10 MG/5ML syrup  4 times daily PRN        03/20/22 2015             Note:  This document was prepared using Dragon voice recognition software and may include  unintentional dictation errors.    Melvenia Needles, PA-C 03/20/22 2343    Harvest Dark, MD 03/22/22 [phone]

## 2022-03-20 NOTE — ED Provider Triage Note (Signed)
Emergency Medicine Provider Triage Evaluation Note  Danielle Lozano , a 41 y.o. female  was evaluated in triage.  Pt complains of cough and congestion for 1 week..  Review of Systems  Positive:  Negative:   Physical Exam  There were no vitals taken for this visit. Gen:   Awake, no distress   Resp:  Normal effort  MSK:   Moves extremities without difficulty  Other:    Medical Decision Making  Medically screening exam initiated at 6:43 PM.  Appropriate orders placed.  Danielle Lozano was informed that the remainder of the evaluation will be completed by another provider, this initial triage assessment does not replace that evaluation, and the importance of remaining in the ED until their evaluation is complete.     Versie Starks, PA-C 03/20/22 1844

## 2022-07-12 IMAGING — CT CT ABD-PELV W/ CM
2 of 4 series · 16 of 46 positions shown, 18 images · IV contrast (APPLIED)
Comparison: None.

CLINICAL DATA: Nausea and vomiting for few days

EXAM:
CT ABDOMEN AND PELVIS WITH CONTRAST
TECHNIQUE: Multidetector CT imaging of the abdomen and pelvis was performed
using the standard protocol following bolus administration of
intravenous contrast.
CONTRAST:  100mL OMNIPAQUE IOHEXOL 350 MG/ML SOLN

[Series 2: routine abd/pel with · axial · 0.87mm/px · z∈[-882,-482]mm · 13 of 89 slices shown, 15 images]
[im 5/89  soft-tissue]
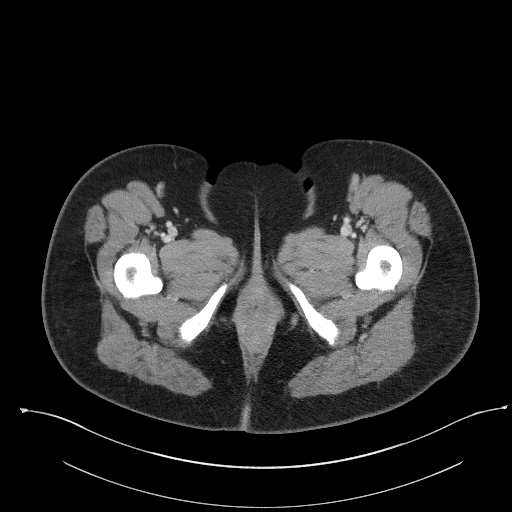
[im 5/89  bone]
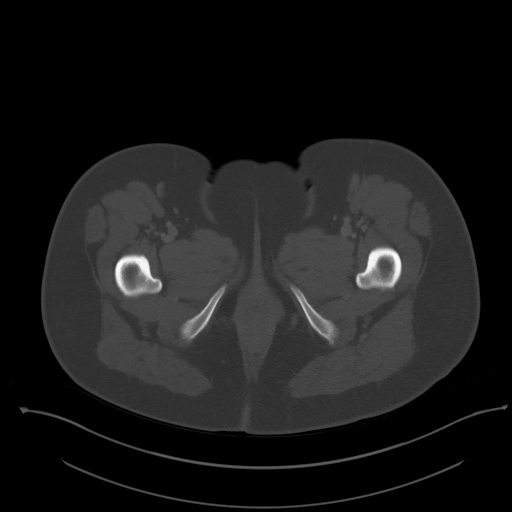
[im 13/89  soft-tissue]
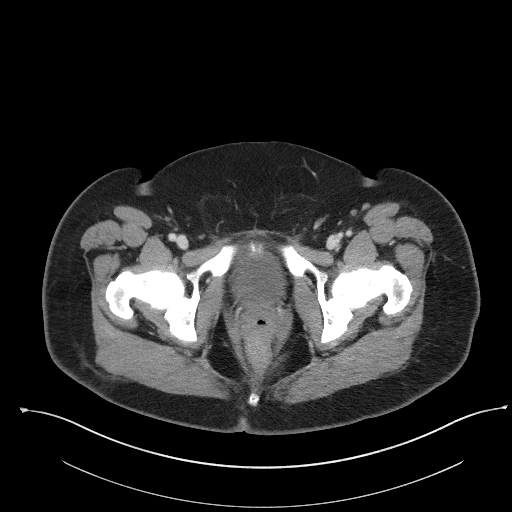
[im 21/89  soft-tissue]
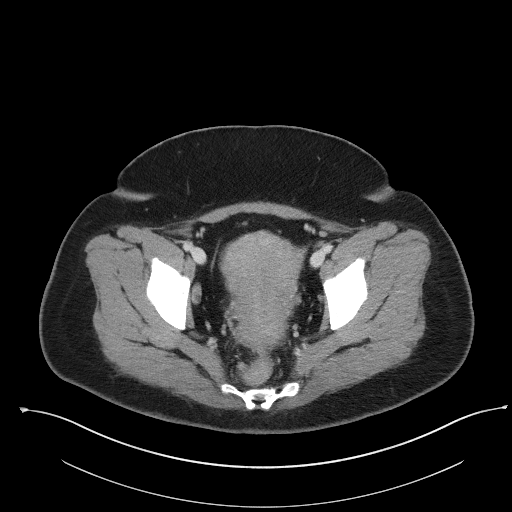
[im 25/89  soft-tissue]
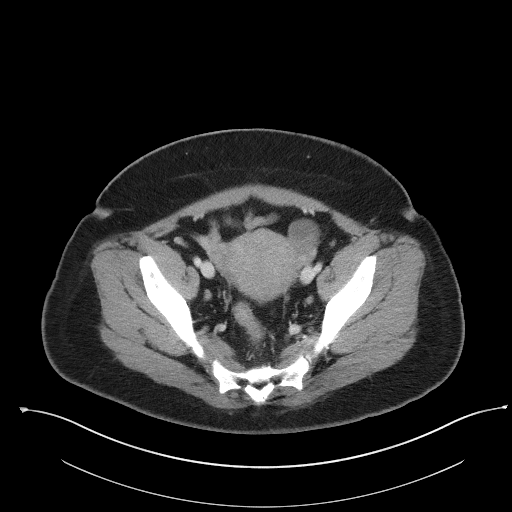
[im 33/89  soft-tissue]
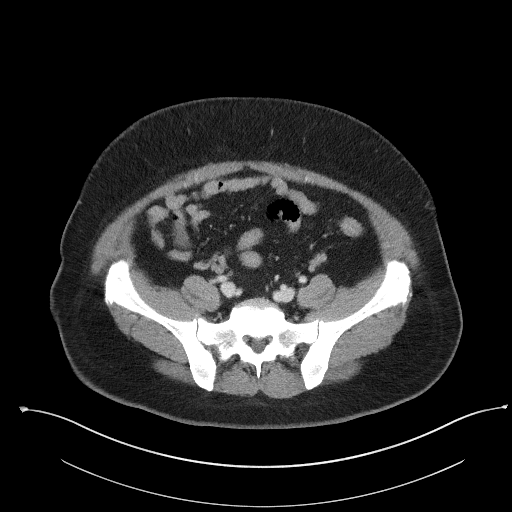
[im 37/89  soft-tissue]
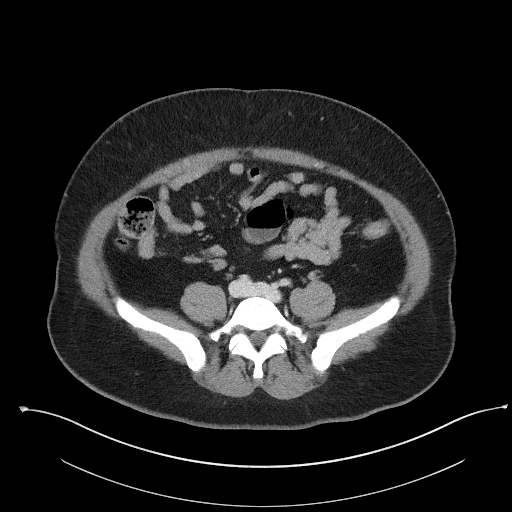
[im 45/89  soft-tissue]
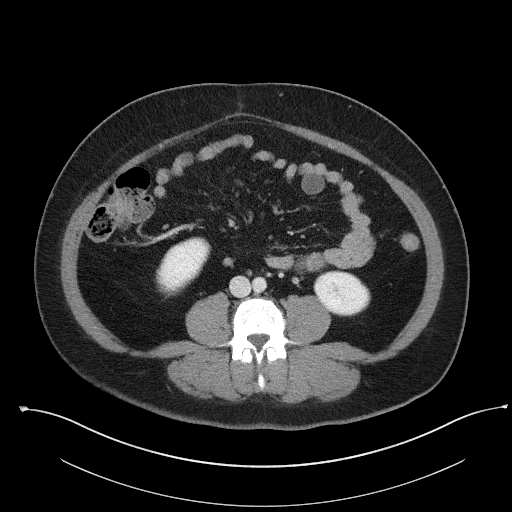
[im 53/89  soft-tissue]
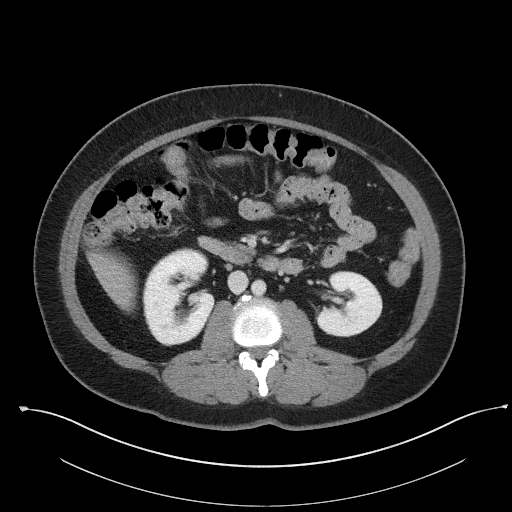
[im 57/89  soft-tissue]
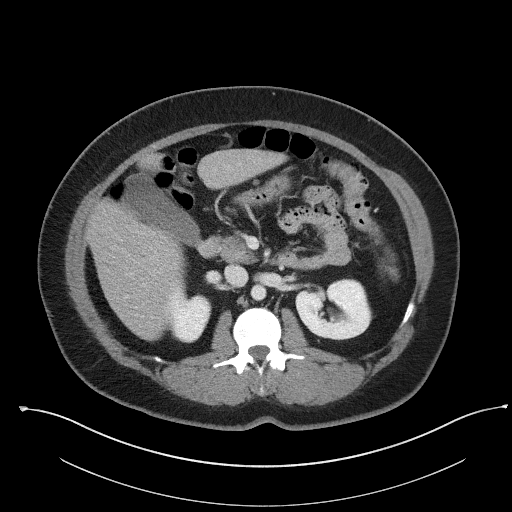
[im 57/89  bone]
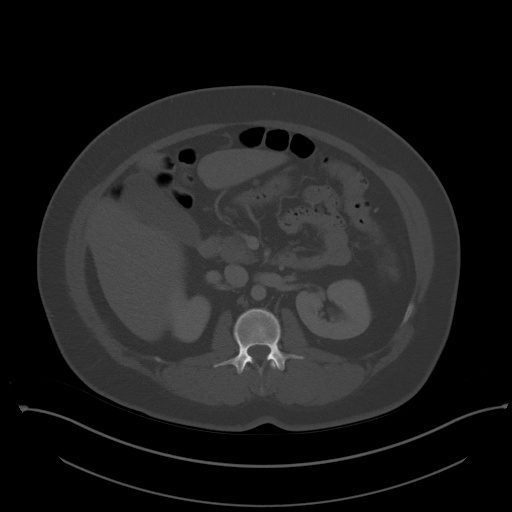
[im 65/89  soft-tissue]
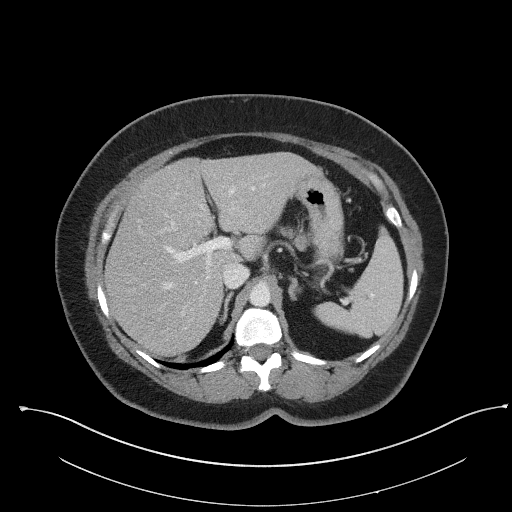
[im 69/89  soft-tissue]
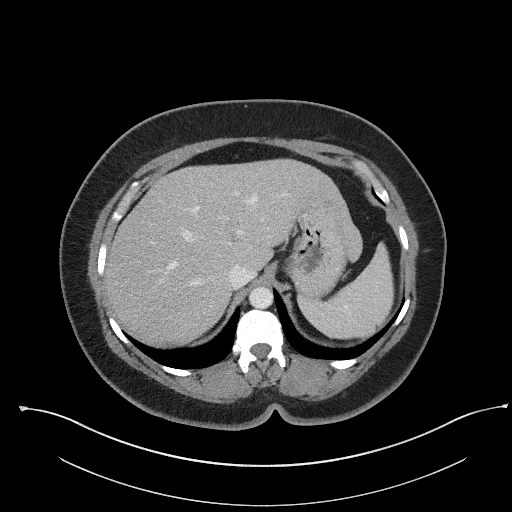
[im 77/89  soft-tissue]
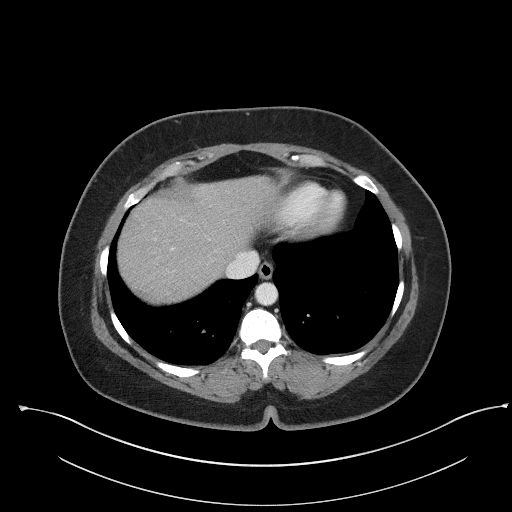
[im 85/89  soft-tissue]
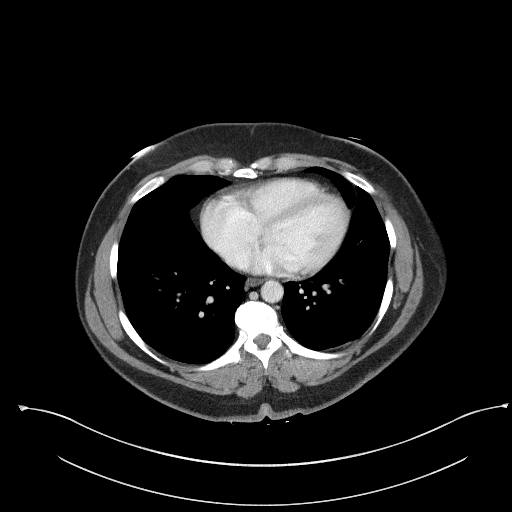

[Series 5: coronal st · coronal · 0.78mm/px · 3 of 106 slices shown]
[im 36/106  soft-tissue]
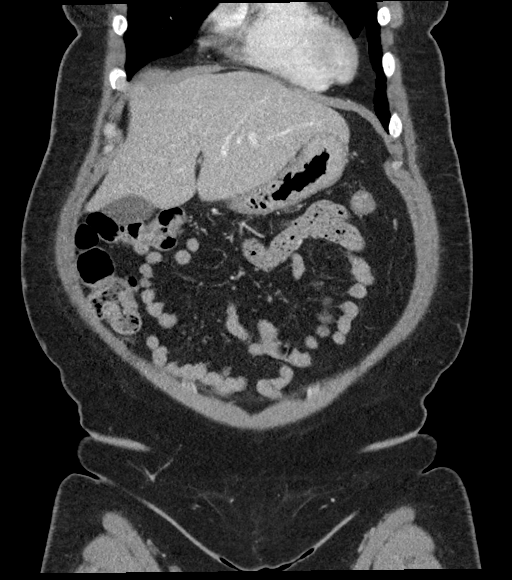
[im 47/106  soft-tissue]
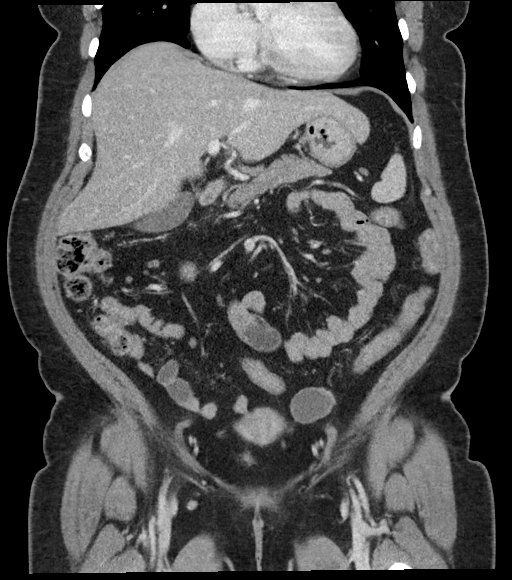
[im 59/106  soft-tissue]
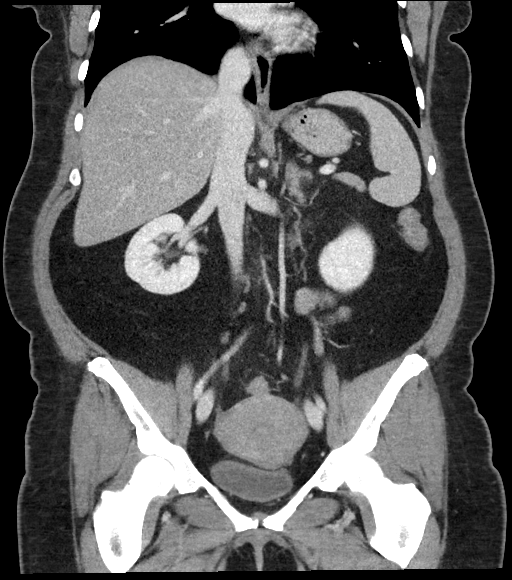

[16 of 46 positions shown; findings below may reference images not displayed]

FINDINGS: Lower chest: No acute abnormality.

Hepatobiliary: Mild fatty infiltration of the liver is noted. No
focal mass is seen. Gallbladder is within normal limits.

Pancreas: Unremarkable. No pancreatic ductal dilatation or
surrounding inflammatory changes.

Spleen: Normal in size without focal abnormality.

Adrenals/Urinary Tract: Adrenal glands are within normal limits.
Kidneys demonstrate a normal enhancement pattern bilaterally. No
renal calculi are seen. The ureters are within normal limits
bilaterally.

Stomach/Bowel: Appendix is within normal limits. No obstructive or
inflammatory changes of the colon are seen. Small bowel and stomach
are unremarkable.

Vascular/Lymphatic: No significant vascular findings are present. No
enlarged abdominal or pelvic lymph nodes.

Reproductive: Heterogeneity in the uterus is noted which may
represent mild leiomyomatous change. Right ovary appears within
normal limits. Left ovary demonstrates a 3.2 cm cystic lesion

Other: No abdominal wall hernia or abnormality. No abdominopelvic
ascites.

Musculoskeletal: No acute or significant osseous findings.
IMPRESSION: Fatty liver.

Changes consistent with uterine fibroid change.

Left ovarian cystic lesion measuring 3.2 cm. No follow-up imaging
recommended. Note: This recommendation does not apply to
premenarchal patients and to those with increased risk (genetic,
family history, elevated tumor markers or other high-risk factors)
of ovarian cancer. Reference: JACR [DATE]):[id number]

## 2022-07-29 ENCOUNTER — Encounter: Payer: Self-pay | Admitting: Family

## 2022-07-29 ENCOUNTER — Ambulatory Visit (LOCAL_COMMUNITY_HEALTH_CENTER): Payer: Self-pay | Admitting: Family

## 2022-07-29 VITALS — BP 111/72 | HR 77 | Ht 60.0 in | Wt 186.6 lb

## 2022-07-29 DIAGNOSIS — Z309 Encounter for contraceptive management, unspecified: Secondary | ICD-10-CM

## 2022-07-29 DIAGNOSIS — Z30013 Encounter for initial prescription of injectable contraceptive: Secondary | ICD-10-CM

## 2022-07-29 MED ORDER — METRONIDAZOLE 500 MG PO TABS: 500.0000 mg | ORAL_TABLET | Freq: Two times a day (BID) | ORAL | 0 refills | Status: DC

## 2022-07-29 MED ORDER — MEDROXYPROGESTERONE ACETATE 150 MG/ML IM SUSP
150.0000 mg | INTRAMUSCULAR | Status: DC
Start: 2022-07-29 — End: 2022-11-11
  Administered 2022-07-29: 150 mg via INTRAMUSCULAR

## 2022-07-29 NOTE — Progress Notes (Signed)
[facility] [place] Number: [phone]  Contraception/Family Planning VISIT ENCOUNTER NOTE  Subjective:   Danielle Lozano is a 42 y.o. G78P0 female here for reproductive life counseling. The patient is currently using Female Condom to prevent pregnancy.  Stopped pills in January 2024 because she could not remember to take them everyday, tried for 1 year. Has been using condoms since but would like to use Depo Provera which she used for 3 years in the past. Last sex 07/22/22 with condom and LMP 07/24/22. Currently scheduled on 08/12/22 for annual physical examination.  The patient does not want a pregnancy in the next year.    Client states they are looking for the following:  High efficacy at preventing pregnancy  Denies abnormal vaginal bleeding, discharge, pelvic pain, problems with intercourse or other gynecologic concerns.    Gynecologic History Patient's last menstrual period was 07/25/2022 (exact date).  Health Maintenance Due  Topic Date Due   COVID-19 Vaccine (1) Never done   Hepatitis C Screening  Never done   INFLUENZA VACCINE  12/29/2021     The following portions of the patient's history were reviewed and updated as appropriate: allergies, current medications, past family history, past medical history, past social history, past surgical history and problem list.  Review of Systems Pertinent items are noted in HPI.   Objective:  BP 111/72   Pulse 77   Ht 5' (1.524 m)   Wt 186 lb 9.6 oz (84.6 kg)   LMP 07/25/2022 (Exact Date)   BMI 36.44 kg/m   Scheduled for PE on 08/12/2022 at [facility]   Assessment and Plan:   Need for emergency contraceptive care was assessed today, not indicated.  Contraception counseling: Reviewed methods in a patient centered fashion and used shared decision making with the patient. Utilized [employer] patient education tools as appropriate. The patient stated there goals  and desires from a method are: High efficacy at preventing pregnancy  We reviewed the following methods in detail based on patient preferences available included: Hormonal Injection  Patient expressed they would like Hormonal Injection  This was provided to the patient today.  Risks, benefits, and typical effectiveness rates were reviewed.  Questions were answered.  Written information was also given to the patient to review.      will follow up in  2 weeks for Annual Physical.   was told to call with any further questions, or with any concerns about this method of contraception or cycle control.  Emphasized use of condoms 100% of the time for STI prevention.   1. Encounter for initial prescription of injectable contraceptive Depo Provera '150mg'$  IM q11-13 wks, #1, 4 refills Use condoms for next 7 days for backup contraception RTC on 08/12/2022 for Annual Physical   Please refer to After Visit Summary for other counseling recommendations.   Return in about 2 weeks (around 08/12/2022) for Yearly physical.  Marline Backbone, [place]

## 2022-07-29 NOTE — Progress Notes (Signed)
Acute appointment for birth control. Pt chose to get the Depo today. See by [place]. Depo administered per order.

## 2022-08-02 NOTE — Addendum Note (Signed)
Addended by: Bradd Burner A on: 08/02/2022 05:06 PM   Modules accepted: Level of Service

## 2022-08-05 ENCOUNTER — Ambulatory Visit: Payer: Self-pay

## 2022-08-12 ENCOUNTER — Ambulatory Visit: Payer: Self-pay

## 2022-10-20 ENCOUNTER — Telehealth: Payer: Self-pay | Admitting: Family Medicine

## 2022-10-20 NOTE — Telephone Encounter (Signed)
Pt had a PE appt that was cancelled due to provider and she missed the rescheduled appt. Now she will be late for the DEPO, could we see her before her next appt?

## 2022-10-20 NOTE — Telephone Encounter (Signed)
LM for pt to return my call.  [place] #[phone] from [facility] [employer] assisted with the call

## 2022-10-20 NOTE — Telephone Encounter (Signed)
If she can get in within the next 3.5 weeks her Depo will still be effective. SoIf we can get her in during that time period please call and get her scheduled.

## 2022-10-21 ENCOUNTER — Telehealth: Payer: Self-pay | Admitting: Family Medicine

## 2022-10-21 NOTE — Telephone Encounter (Signed)
Pt called in regards to a missed call about getting in sooner for her depo.

## 2022-10-21 NOTE — Telephone Encounter (Signed)
Pt notified to keep her appointment for 6/13 @ 3:00 pm and that her Depo will cover her up to that date.  Pt verbalizes understanding.  [facility] - Interpreter,  assisted with the call

## 2022-11-11 ENCOUNTER — Ambulatory Visit (LOCAL_COMMUNITY_HEALTH_CENTER): Payer: Self-pay | Admitting: Physician Assistant

## 2022-11-11 ENCOUNTER — Encounter: Payer: Self-pay | Admitting: Physician Assistant

## 2022-11-11 VITALS — BP 114/79 | Ht 60.5 in | Wt 187.4 lb

## 2022-11-11 DIAGNOSIS — E669 Obesity, unspecified: Secondary | ICD-10-CM | POA: Insufficient documentation

## 2022-11-11 DIAGNOSIS — R87610 Atypical squamous cells of undetermined significance on cytologic smear of cervix (ASC-US): Secondary | ICD-10-CM

## 2022-11-11 DIAGNOSIS — Z3009 Encounter for other general counseling and advice on contraception: Secondary | ICD-10-CM

## 2022-11-11 DIAGNOSIS — R87619 Unspecified abnormal cytological findings in specimens from cervix uteri: Secondary | ICD-10-CM | POA: Insufficient documentation

## 2022-11-11 DIAGNOSIS — Z30013 Encounter for initial prescription of injectable contraceptive: Secondary | ICD-10-CM

## 2022-11-11 DIAGNOSIS — Z0000 Encounter for general adult medical examination without abnormal findings: Secondary | ICD-10-CM

## 2022-11-11 MED ORDER — MEDROXYPROGESTERONE ACETATE 150 MG/ML IM SUSP
150.0000 mg | INTRAMUSCULAR | Status: DC
Start: 2022-11-11 — End: 2023-06-07
  Administered 2022-11-11 – 2023-02-02 (×2): 150 mg via INTRAMUSCULAR

## 2022-11-11 NOTE — Progress Notes (Signed)
Depo given, tolerated well, next Depo card given..Marci Polito Brewer-Jensen, RN  

## 2022-11-11 NOTE — Progress Notes (Signed)
Patient here for PE and Depo. Last PE was 07/31/2021. Patient was using OC for [facility], then switched to Depo. Last Depo was 07/29/22, 15 weeks ago. Last Pap was 03/28/2020, see in outguide. Interpreter ID [phone], and then [place].Burt Knack, RN

## 2022-11-11 NOTE — Progress Notes (Signed)
[facility] [address] Main Number: [phone]   Family Planning Visit- Initial Visit  Subjective:  Danielle Lozano is a 42 y.o.  G5P0   being seen today for an initial annual visit and to discuss reproductive life planning.  The patient is currently using Hormonal Injection for pregnancy prevention. Patient reports   does not want a pregnancy in the next year.     report they are looking for a method that provides High efficacy at preventing pregnancy and Minimal bleeding/improved bleeding profile  Patient has the following medical conditions has Abnormal Pap smear of cervix and Class 2 obesity with body mass index (BMI) of 36.0 to 36.9 in adult on their problem list.  Chief Complaint  Patient presents with   Annual Exam   Contraception    Patient reports some recent weight gain. Reports h/o prediabetes.  Patient denies other concerns.   Body mass index is 36 kg/m. - Patient is eligible for diabetes screening based on BMI> 25 and age >35?  yes HA1C ordered? yes  Patient reports 1  partner/s in last year. Desires STI screening?  No - low risk  Has patient been screened once for HCV in the past?  Yes 03/30/20 = neg  No results found for: "HCVAB"  Does the patient have current drug use (including MJ), have a partner with drug use, and/or has been incarcerated since last result? No  If yes-- Screen for HCV through [facility]   Does the patient meet criteria for HBV testing? No  Criteria:  -Household, sexual or needle sharing contact with HBV -History of drug use -HIV positive -Those with known Hep C   Health Maintenance Due  Topic Date Due   COVID-19 Vaccine (1) Never done   Hepatitis C Screening  Never done    Review of Systems  Constitutional: Negative.        Neg except weight gain.  HENT: Negative.    Eyes: Negative.   Respiratory: Negative.    Cardiovascular: Negative.    Gastrointestinal: Negative.   Genitourinary: Negative.   Musculoskeletal: Negative.   Skin: Negative.   Neurological: Negative.   Endo/Heme/Allergies: Negative.   Psychiatric/Behavioral: Negative.      The following portions of the patient's history were reviewed and updated as appropriate: allergies, current medications, past family history, past medical history, past social history, past surgical history and problem list. Problem list updated.   See flowsheet for other program required questions.  Objective:   Vitals:   11/11/22 1522  BP: 114/79  Weight: 187 lb 6.4 oz (85 kg)  Height: 5' 0.5" (1.537 m)    Physical Exam Constitutional:      Appearance: Normal appearance. She is obese.  HENT:     Head: Normocephalic and atraumatic.  Cardiovascular:     Rate and Rhythm: Normal rate and regular rhythm.     Heart sounds: Normal heart sounds.  Pulmonary:     Effort: Pulmonary effort is normal.     Breath sounds: Normal breath sounds.  Chest:  Breasts:    Tanner Score is 5.     Right: Normal. No mass, nipple discharge, skin change or tenderness.     Left: Normal. No mass, nipple discharge, skin change or tenderness.  Abdominal:     Palpations: Abdomen is soft.  Musculoskeletal:        General: Normal range of motion.  Lymphadenopathy:     Upper Body:  Right upper body: No supraclavicular, axillary or pectoral adenopathy.     Left upper body: No supraclavicular, axillary or pectoral adenopathy.  Skin:    General: Skin is warm and dry.  Neurological:     General: No focal deficit present.     Mental Status: She is alert and oriented to person, place, and time.  Psychiatric:        Mood and Affect: Mood normal.        Behavior: Behavior normal.   Pelvic exam not indicated.    Assessment and Plan:  Danielle Lozano is a 42 y.o. female presenting to the [facility] Department for an initial annual wellness/contraceptive visit  Contraception  counseling: Reviewed options based on patient desire and reproductive life plan. Patient is interested in Hormonal Injection. This was provided to the patient today. Risks, benefits, and typical effectiveness rates were reviewed.  Questions were answered.  Written information was also given to the patient to review.    The patient will follow up in  11 weeks for surveillance.  The patient was told to call with any further questions, or with any concerns about this method of contraception.  Emphasized use of condoms 100% of the time for STI prevention.  Educated on ECP and assessed for need of ECP. Patient reported > 120 hours .  Reviewed options and patient desired No method of ECP, declined all    1. Family planning services Continue DMPA 150mg  IM q 3 mo for 1 year. Please give injection today.  - medroxyPROGESTERone (DEPO-PROVERA) injection 150 mg  2. Encounter for well woman exam without gynecological exam Enc to establish PCP and dental home.  3. Atypical squamous cells of undetermined significance on cytologic smear of cervix (ASC-US) Repeat co-testing due 04/2023.  4. Class 2 obesity with body mass index (BMI) of 36.0 to 36.9 in adult, unspecified obesity type, unspecified whether serious comorbidity present Enc walking, other exercise. Pt offered referral to nutritionist, written nutritional info, but declined. Agrees to diabetes screening today. - Hgb A1c w/o eAG   Return in about 11 weeks (around 01/27/2023) for Routine DMPA injection.  No future appointments.  Landry Dyke, PA-C

## 2022-11-12 ENCOUNTER — Telehealth: Payer: Self-pay | Admitting: *Deleted

## 2022-11-12 LAB — HGB A1C W/O EAG: Hgb A1c MFr Bld: 7.8 % — ABNORMAL HIGH (ref 4.8–5.6)

## 2022-11-12 NOTE — Progress Notes (Signed)
Attempted to contact. Left a message and will try again tomorrow.

## 2022-11-15 ENCOUNTER — Telehealth: Payer: Self-pay

## 2022-11-15 NOTE — Telephone Encounter (Signed)
-----   Message from Helena Middleton, FNP sent at 11/12/2022  9:58 AM EDT ----- Diabetes. Please contact patient and encourage seeing a PCP. 

## 2022-11-15 NOTE — Telephone Encounter (Signed)
LM for pt to return my call. Marelene Yemen assisted with the call

## 2022-11-16 NOTE — Telephone Encounter (Signed)
-----   Message from [place], [place] sent at 11/12/2022  9:58 AM EDT ----- Diabetes. Please contact patient and encourage seeing a PCP.

## 2022-11-17 NOTE — Telephone Encounter (Signed)
-----   Message from Helena Middleton, FNP sent at 11/12/2022  9:58 AM EDT ----- Diabetes. Please contact patient and encourage seeing a PCP. 

## 2022-11-18 NOTE — Addendum Note (Signed)
Addended by: Landry Dyke on: 11/18/2022 04:55 PM   Modules accepted: Level of Service

## 2022-11-18 NOTE — Addendum Note (Signed)
Addended by: Landry Dyke on: 11/18/2022 05:37 PM   Modules accepted: Level of Service

## 2022-11-22 ENCOUNTER — Telehealth: Payer: Self-pay

## 2022-11-22 NOTE — Telephone Encounter (Signed)
RN contacted pt and let her know that her HA1C lab that tests for Diabetes came back positive for Diabetes. Let pt know to see a PCP ASAP. In the meantime encouraged her to increase excercise and improve diet in order to better manage it till she can see a PCP. All questions answered.

## 2023-01-27 ENCOUNTER — Ambulatory Visit: Payer: Self-pay

## 2023-02-02 ENCOUNTER — Ambulatory Visit (LOCAL_COMMUNITY_HEALTH_CENTER): Payer: Self-pay

## 2023-02-02 ENCOUNTER — Ambulatory Visit: Payer: Self-pay

## 2023-02-02 VITALS — BP 131/77 | Ht 60.5 in | Wt 179.0 lb

## 2023-02-02 DIAGNOSIS — Z3009 Encounter for other general counseling and advice on contraception: Secondary | ICD-10-CM

## 2023-02-02 DIAGNOSIS — Z3042 Encounter for surveillance of injectable contraceptive: Secondary | ICD-10-CM

## 2023-02-02 NOTE — Progress Notes (Signed)
11 weeks and 6 days post hormonal injection  No concerns or issues with injection reported. Medroxyprogesterone Acetate 150 mg given per order dated 11/11/2022 by A. Streilein, PA-C. Given IM right deltoid. Tolerated well. Reminder card provided to call for next appointment due 04/20/2023. Lang line used # [id number] .

## 2023-04-26 ENCOUNTER — Ambulatory Visit: Payer: Self-pay

## 2023-06-07 ENCOUNTER — Encounter: Payer: Self-pay | Admitting: Nurse Practitioner

## 2023-06-07 ENCOUNTER — Ambulatory Visit (LOCAL_COMMUNITY_HEALTH_CENTER): Payer: Self-pay | Admitting: Nurse Practitioner

## 2023-06-07 VITALS — BP 135/85 | Ht 60.5 in | Wt 172.0 lb

## 2023-06-07 DIAGNOSIS — Z3009 Encounter for other general counseling and advice on contraception: Secondary | ICD-10-CM

## 2023-06-07 DIAGNOSIS — Z30011 Encounter for initial prescription of contraceptive pills: Secondary | ICD-10-CM

## 2023-06-07 DIAGNOSIS — Z01419 Encounter for gynecological examination (general) (routine) without abnormal findings: Secondary | ICD-10-CM

## 2023-06-07 MED ORDER — NORGESTIM-ETH ESTRAD TRIPHASIC 0.18/0.215/0.25 MG-25 MCG PO TABS
1.0000 | ORAL_TABLET | Freq: Every day | ORAL | 5 refills | Status: AC
Start: 2023-06-07 — End: ?

## 2023-06-07 NOTE — Progress Notes (Addendum)
 [facility] Problem Visit  [facility]  Subjective:  Danielle Lozano is a 43 y.o. being seen today for   Chief Complaint  Patient presents with   Contraception    Bleeding x2 months with depo     Patient reports to clinic for PAP and to discuss change in contraceptive therapy. The patient indicates 1 female partner, vaginal sex, and no condom use. STI history unknown. Patient has been receiving depo injections since 07/2022 (11 months). She indicates irregular bleeding since beginning Depo and has bleed as much as every day for several months in a row. She indicates stopping bleeding a few weeks ago and then last week started spotting again with dark blood. She states she has been on Depo in the past and not had these side effects. Patient was originally switched to depo most recently after being on OCP and reporting she could not remember to take the medication. She indicates when on the pill she felt like she had no sex drive and was tired.     Does the patient have a current or past history of drug use? No   No components found for: HCV]   Health Maintenance Due  Topic Date Due   Hepatitis C Screening  Never done   INFLUENZA VACCINE  12/30/2022   COVID-19 Vaccine (1 - 2024-25 season) Never done   Cervical Cancer Screening (HPV/Pap Cotest)  03/29/2023    Review of Systems  Constitutional:        History of low sex drive and fatigue while on OCP in the past, but not currently.   Genitourinary:        Irregular menstrual bleeding.   All other systems reviewed and are negative.   The following portions of the patient's history were reviewed and updated as appropriate: allergies, current medications, past family history, past medical history, past social history, past surgical history and problem list. Problem list updated.   See flowsheet for other program required questions.  Objective:   Vitals:   06/07/23 1042  BP: 135/85   Weight: 172 lb (78 kg)  Height: 5' 0.5 (1.537 m)    Physical Exam Nursing note reviewed.  Constitutional:      Appearance: Normal appearance.  HENT:     Head: Normocephalic.     Mouth/Throat:     Lips: Pink. No lesions.     Tongue: No lesions. Tongue does not deviate from midline.     Pharynx: Uvula midline.     Tonsils: No tonsillar exudate.  Eyes:     General:        Right eye: No discharge.        Left eye: No discharge.  Pulmonary:     Effort: Pulmonary effort is normal.  Genitourinary:    General: Normal vulva.     Exam position: Lithotomy position.     Pubic Area: No rash or pubic lice.      Tanner stage (genital): 5.     Labia:        Right: No rash, tenderness, lesion or injury.        Left: No rash, tenderness, lesion or injury.      Vagina: Normal. No vaginal discharge, erythema, tenderness, bleeding or lesions.     Cervix: Normal. No cervical motion tenderness, discharge, friability, lesion, erythema, cervical bleeding or eversion.     Uterus: Normal.      Adnexa: Right adnexa normal and left adnexa normal.  Comments: pH<4.5 Skin:    General: Skin is warm and dry.     Findings: No rash.     Comments: Skin tone appropriate for ethnicity. Exposed areas only.  Neurological:     Mental Status: She is alert and oriented to person, place, and time.  Psychiatric:        Attention and Perception: Attention normal.        Mood and Affect: Mood and affect normal.        Speech: Speech normal.        Behavior: Behavior normal. Behavior is cooperative.        Thought Content: Thought content normal.    Assessment and Plan:  Danielle Lozano is a 43 y.o. female presenting to the [facility] Department for a Women's Health problem visit  1. Family planning (Primary) Contraception counseling: Reviewed options based on patient desire and reproductive life plan. Patient is interested in Oral Contraceptive. This was provided to the patient today.    Risks, benefits, and typical effectiveness rates were reviewed.  Questions were answered.  Written information was also given to the patient to review.    The patient will follow up in  6 months for surveillance.  The patient was told to call with any further questions, or with any concerns about this method of contraception.  Emphasized use of condoms 100% of the time for STI prevention.  Educated on ECP and assessed need for ECP. Patient was NOT offered ECP based on > 120 hours .  Patient is within 7 days of unprotected sex.   Patient denies current or history of migraine with aura, denies smoking or tobacco use, denies personal history of DVT/CVA/AMI/PE, and denies personal history of cancer, specifically breast cancer. After shared-decision making discussion with the patient it was decided she would try COC again. She was given warning signs that would require immediate evaluation and advised to contact this office if she experiences the same side effects as before with OCP (fatigue and low sex drive). Patient verbalized understanding and agreed.   - Norgestim-Eth Estrad Triphasic (NORGESTIMATE-ETHINYL ESTRADIOL TRIPHASIC) 0.18/0.215/0.25 MG-25 MCG tab; Take 1 tablet by mouth daily.  Dispense: 28 tablet; Refill: 5  2. Well woman exam Complete well woman physical not performed in office today as patient had a physical 6 months ago. Patient can return to this office around June 2025 for her next due physical.  CBE not performed in office today. Unsure if patient has had a mammogram in the past. This should be evaluated at next physical visit and scheduled if timing is appropriate. Also, CBE should be performed then.  - IGP, Aptima HPV  Return in about 6 months (around 12/05/2023) for annual well-woman exam.  No future appointments.  Due to language barrier, a Spanish interpreter Kemp Redder) was present during the history-taking and subsequent discussion (and for the physical exam) with  this patient.  Clarita LITTIE Narrow, NP

## 2023-06-07 NOTE — Progress Notes (Signed)
 Pt is here for BC change, depo to OCP. The patient was dispensed #6 packs tri lo mili today. I provided counseling today regarding the medication. We discussed the medication, the side effects and when to call clinic. Patient given the opportunity to ask questions. Questions answered.  Larraine JONELLE Northern, RN

## 2023-06-10 LAB — IGP, APTIMA HPV
HPV Aptima: NEGATIVE
PAP Smear Comment: 0

## 2023-06-13 NOTE — Progress Notes (Signed)
 PAP NILM, HPV (-) on 06/07/23. Please send letter to patient informing her that her next PAP will be due in 5 years around 06/07/2028 per ASCCP guidelines. Thank you, Marylu Lund L. Daric Koren, FNP-C
